# Patient Record
Sex: Male | Born: 1965 | Race: White | Hispanic: No | Smoking: Former smoker
Health system: Southern US, Community
[De-identification: ages and names within clinical notes are randomized; demographics above are authoritative.]

---

## 2014-09-18 ENCOUNTER — Other Ambulatory Visit (HOSPITAL_BASED_OUTPATIENT_CLINIC_OR_DEPARTMENT_OTHER): Payer: Self-pay | Admitting: Family Medicine

## 2014-09-18 DIAGNOSIS — E049 Nontoxic goiter, unspecified: Secondary | ICD-10-CM

## 2014-09-21 ENCOUNTER — Ambulatory Visit (HOSPITAL_BASED_OUTPATIENT_CLINIC_OR_DEPARTMENT_OTHER): Payer: BLUE CROSS/BLUE SHIELD

## 2016-12-06 ENCOUNTER — Encounter: Payer: Self-pay | Admitting: Family Medicine

## 2016-12-06 ENCOUNTER — Ambulatory Visit (INDEPENDENT_AMBULATORY_CARE_PROVIDER_SITE_OTHER): Payer: Worker's Compensation | Admitting: Family Medicine

## 2016-12-06 DIAGNOSIS — S3992XA Unspecified injury of lower back, initial encounter: Secondary | ICD-10-CM | POA: Diagnosis not present

## 2016-12-06 NOTE — Patient Instructions (Signed)
You have a lumbar strain. Ok to take tylenol for baseline pain relief (1-2 extra strength tabs 3x/day) Naproxen 500mg  twice a day with food for pain and inflammation as needed. Ok to take the percocet, robaxin you have as needed (as you know percocet has tylenol in it though so don't take these at the same time). Stay as active as possible. Start physical therapy and do home exercises, stretches on days you don't go to therapy. See work note. If not improving, will consider imaging. Follow up with me 1 month after you've started therapy for reevaluation.

## 2016-12-07 DIAGNOSIS — S3992XD Unspecified injury of lower back, subsequent encounter: Secondary | ICD-10-CM | POA: Insufficient documentation

## 2016-12-07 NOTE — Addendum Note (Signed)
Addended by: Kathi SimpersWISE, Shalise Rosado F on: 12/07/2016 04:48 PM   Modules accepted: Orders

## 2016-12-07 NOTE — Progress Notes (Addendum)
PCP: Joycelyn RuaMeyers, Stephen, MD  Subjective:   HPI: Patient is a 51 y.o. male here for low back pain.  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 On 5/8 he was gait training a patient who was starting to fall. He caught the patient but when he went to get back up Mr. Noralee StainGrover felt a sharp pain in low back more on the right side. No radiation into legs. No numbness or tingling. No bowel/bladder dysfunction. He has had two rounds of prednisone, robaxin, ibuprofen, percocet. Has improved though pain still worse with prolonged sitting. Pain level is 2/10 and a dull ache. No prior issues with his back. Hasn't started any stretches or exercises yet. Has been out of work as they don't have any light duty.  No past medical history on file.  No current outpatient prescriptions on file prior to visit.   No current facility-administered medications on file prior to visit.     No past surgical history on file.  Allergies  Allergen Reactions  . Statins     Social History   Social History  . Marital status: Divorced    Spouse name: N/A  . Number of children: N/A  . Years of education: N/A   Occupational History  . Not on file.   Social History Main Topics  . Smoking status: Never Smoker  . Smokeless tobacco: Never Used  . Alcohol use Not on file  . Drug use: Unknown  . Sexual activity: Not on file   Other Topics Concern  . Not on file   Social History Narrative  . No narrative on file    No family history on file.  BP (!) 141/95   Pulse 93   Ht 6\' 2"  (1.88 m)   Wt 250 lb (113.4 kg)   BMI 32.10 kg/m   Review of Systems: See HPI above.     Objective:  Physical Exam:  Gen: NAD, comfortable in exam room  Back: No gross deformity, scoliosis. TTP mildly right lumbar paraspinal region.  No midline or bony TTP. FROM with pain on flexion. Strength LEs 5/5 all muscle groups.   2+ MSRs in patellar and achilles tendons, equal bilaterally. Negative  SLRs. Sensation intact to light touch bilaterally. Negative logroll bilateral hips Negative fabers and piriformis stretches.   Assessment & Plan:  1. Low back injury - consistent with lumbar strain.  S/p two courses of prednisone, robaxin, ibuprofen, percocet.  Clinically improving well.  Will switch to naproxen twice a day with food.  Start physical therapy and do home exercises, stretches on days he doesn't go to therapy.  Light duty note provided - out of work if nothing available.  F/u 1 month after starting therapy.

## 2016-12-07 NOTE — Assessment & Plan Note (Signed)
consistent with lumbar strain.  S/p two courses of prednisone, robaxin, ibuprofen, percocet.  Clinically improving well.  Will switch to naproxen twice a day with food.  Start physical therapy and do home exercises, stretches on days he doesn't go to therapy.  Light duty note provided - out of work if nothing available.  F/u 1 month after starting therapy.

## 2016-12-11 ENCOUNTER — Telehealth: Payer: Self-pay | Admitting: Family Medicine

## 2016-12-11 MED ORDER — NAPROXEN 500 MG PO TABS: 500.0000 mg | ORAL_TABLET | Freq: Two times a day (BID) | ORAL | 2 refills | Status: DC | PRN

## 2016-12-11 NOTE — Telephone Encounter (Signed)
I'm sorry about that - I sent it in to [facility] [place] with a couple refills if needed.  Thanks!

## 2016-12-11 NOTE — Telephone Encounter (Signed)
Patient requesting Rx for Naproxen 500mg . States this was discussed in office visit   [facility] [place]

## 2017-01-05 ENCOUNTER — Encounter: Payer: Self-pay | Admitting: Family Medicine

## 2017-01-05 ENCOUNTER — Ambulatory Visit (INDEPENDENT_AMBULATORY_CARE_PROVIDER_SITE_OTHER): Payer: Worker's Compensation | Admitting: Family Medicine

## 2017-01-05 DIAGNOSIS — S3992XD Unspecified injury of lower back, subsequent encounter: Secondary | ICD-10-CM

## 2017-01-05 NOTE — Patient Instructions (Signed)
Continue with the physical therapy and home exercises. Continue with light duty. We will go ahead with an MRI to assess for persistent disc herniation. I will call you with results and next steps.

## 2017-01-06 NOTE — Assessment & Plan Note (Signed)
Concerning he's not improving as expected and now has some weakness of right leg with plantarflexion of ankle.  S/p 8 visits of PT.  Continue PT, home exercises.  Will go ahead with MRI to assess for disc herniation.  S/p two courses of prednisone, robaxin, ibuprofen, percocet.  Continue naproxen.  Light duty note provided - out of work if nothing available.

## 2017-01-06 NOTE — Progress Notes (Addendum)
PCP: Joycelyn RuaMeyers, Stephen, MD  Subjective:   HPI: Patient is a 51 y.o. male here for low back pain.  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 On 5/8 he was gait training a patient who was starting to fall. He caught the patient but when he went to get back up Mr. Noralee StainGrover felt a sharp pain in low back more on the right side. No radiation into legs. No numbness or tingling. No bowel/bladder dysfunction. He has had two rounds of prednisone, robaxin, ibuprofen, percocet. Has improved though pain still worse with prolonged sitting. Pain level is 2/10 and a dull ache. No prior issues with his back. Hasn't started any stretches or exercises yet. Has been out of work as they don't have any light duty.  6/29: Patient reports he's mildly improved. Doing physical therapy and home exercises. About 8 visits to date. Pain 1/10 level, up to 3/10 at times, dull. Some weakness felt in right leg - cannot walk on tiptoes on this side. Taking naproxen which helps. No bowel/bladder dysfunction. No radiation of pain into right leg.  No past medical history on file.  Current Outpatient Prescriptions on File Prior to Visit  Medication Sig Dispense Refill  . ALPRAZolam (XANAX) 0.5 MG tablet TAKE 1 TABLET BY MOUTH 3 TIMES A DAY AS NEEDED FOR ANXIETY  1  . naproxen (NAPROSYN) 500 MG tablet Take 1 tablet (500 mg total) by mouth 2 (two) times daily as needed. 60 tablet 2  . SYNTHROID 150 MCG tablet      No current facility-administered medications on file prior to visit.     No past surgical history on file.  Allergies  Allergen Reactions  . Statins     Social History   Social History  . Marital status: Divorced    Spouse name: N/A  . Number of children: N/A  . Years of education: N/A   Occupational History  . Not on file.   Social History Main Topics  . Smoking status: Never Smoker  . Smokeless tobacco: Never Used  . Alcohol use Not on file  . Drug use: Unknown   . Sexual activity: Not on file   Other Topics Concern  . Not on file   Social History Narrative  . No narrative on file    No family history on file.  BP (!) 156/92   Pulse 80   Ht 6\' 2"  (1.88 m)   Wt 250 lb (113.4 kg)   BMI 32.10 kg/m   Review of Systems: See HPI above.     Objective:  Physical Exam:  Gen: NAD, comfortable in exam room  Back: No gross deformity, scoliosis. TTP mildly right lumbar paraspinal region.  No midline or bony TTP. FROM with mild pain all motions. Strength LEs 5/5 all muscle groups though cannot toe walk right leg.   2+ MSRs in patellar and 1+ achilles tendons, equal bilaterally. Negative SLRs. Sensation intact to light touch bilaterally. Negative logroll bilateral hips Negative fabers and piriformis stretches.   Assessment & Plan:  1. Low back injury - Concerning he's not improving as expected and now has some weakness of right leg with plantarflexion of ankle.  S/p 8 visits of PT.  Continue PT, home exercises.  Will go ahead with MRI to assess for disc herniation.  S/p two courses of prednisone, robaxin, ibuprofen, percocet.  Continue naproxen.  Light duty note provided - out of work if nothing available.  Addendum:  MRI reviewed and discussed  with patient.  No evidence disc herniation though he does have degenerative changes especially lower lumbar spine.  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  This would be the nerve root irritated with weakness on plantarflexion however, difficulty toe walking.  Recommended he continue therapy for the next 2- 2 1/2 weeks and follow up with me at that time.  We discussed trial of ESI at this level, nortriptyline or gabapentin.  Advised I doubt he would need any surgical intervention.

## 2017-01-08 NOTE — Addendum Note (Signed)
Addended by: Kathi SimpersWISE, Elis Rawlinson F on: 01/08/2017 11:35 AM   Modules accepted: Orders

## 2017-02-05 ENCOUNTER — Encounter: Payer: Self-pay | Admitting: Family Medicine

## 2017-02-05 ENCOUNTER — Ambulatory Visit (INDEPENDENT_AMBULATORY_CARE_PROVIDER_SITE_OTHER): Payer: Worker's Compensation | Admitting: Family Medicine

## 2017-02-05 DIAGNOSIS — S3992XD Unspecified injury of lower back, subsequent encounter: Secondary | ICD-10-CM

## 2017-02-05 NOTE — Patient Instructions (Signed)
Continue with home exercises over next month to 6 weeks. Continue with light duty. If this stays as you are currently or worsens (I don't expect this to worsen though) I would recommend seeing a neurosurgeon for evaluation. They would likely recommend epidural steroid injection(s) but also review what surgery would entail, recovery time. Follow up with me in 5-6 weeks for reevaluation.

## 2017-02-06 NOTE — Assessment & Plan Note (Signed)
Lumbar strain with underlying degenerative changes of lumbar spine confirmed on MRI - evidence of bilateral nerve root irritation at S1 level.  He is now only slowly progressing.  Finished with physical therapy at this point - encouraged continued home exercises for next 6 weeks.  S/p two courses of prednisone, robaxin, ibuprofen, percocet.  Continue naproxen.  Light duty note provided - out of work if nothing available.  F/u in 5-6 weeks.  Discussed ESI, neurosurgery referral if not continuing to improve.

## 2017-02-06 NOTE — Progress Notes (Signed)
PCP: Joycelyn RuaMeyers, Stephen, MD  Subjective:   HPI: Patient is a 51 y.o. male here for low back pain.  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 On 5/8 he was gait training a patient who was starting to fall. He caught the patient but when he went to get back up Mr. Noralee StainGrover felt a sharp pain in low back more on the right side. No radiation into legs. No numbness or tingling. No bowel/bladder dysfunction. He has had two rounds of prednisone, robaxin, ibuprofen, percocet. Has improved though pain still worse with prolonged sitting. Pain level is 2/10 and a dull ache. No prior issues with his back. Hasn't started any stretches or exercises yet. Has been out of work as they don't have any light duty.  6/29: Patient reports he's mildly improved. Doing physical therapy and home exercises. About 8 visits to date. Pain 1/10 level, up to 3/10 at times, dull. Some weakness felt in right leg - cannot walk on tiptoes on this side. Taking naproxen which helps. No bowel/bladder dysfunction. No radiation of pain into right leg.  7/30: Patient reports he's about 90-95% better but still having some residual problems. Mild improvement in being able to walk on tiptoes on right. Has some tightness/strange feeling in posterior left leg when bending down to pick something up. Back pain is 1-2/10, soreness at rest. No skin changes. No bowel/bladder disfunction.  No past medical history on file.  Current Outpatient Prescriptions on File Prior to Visit  Medication Sig Dispense Refill  . ALPRAZolam (XANAX) 0.5 MG tablet TAKE 1 TABLET BY MOUTH 3 TIMES A DAY AS NEEDED FOR ANXIETY  1  . naproxen (NAPROSYN) 500 MG tablet Take 1 tablet (500 mg total) by mouth 2 (two) times daily as needed. 60 tablet 2  . SYNTHROID 150 MCG tablet      No current facility-administered medications on file prior to visit.     No past surgical history on file.  Allergies  Allergen Reactions  .  Statins     Social History   Social History  . Marital status: Divorced    Spouse name: N/A  . Number of children: N/A  . Years of education: N/A   Occupational History  . Not on file.   Social History Main Topics  . Smoking status: Never Smoker  . Smokeless tobacco: Never Used  . Alcohol use Not on file  . Drug use: Unknown  . Sexual activity: Not on file   Other Topics Concern  . Not on file   Social History Narrative  . No narrative on file    No family history on file.  BP (!) 145/93   Pulse (!) 106   Ht 6\' 2"  (1.88 m)   Wt 250 lb (113.4 kg)   BMI 32.10 kg/m   Review of Systems: See HPI above.     Objective:  Physical Exam:  Gen: NAD, comfortable in exam room  Back: No gross deformity, scoliosis. TTP mildly right lumbar paraspinal region.  No midline or bony TTP. FROM with mild pain on flexion, mild hamstring tightness bilaterally. Strength LEs 5/5 all muscle groups with improved right toe walk but still stronger on left.   2+ MSRs in patellar and 1+ achilles tendons, equal bilaterally. Negative SLRs. Sensation intact to light touch bilaterally. Negative logroll bilateral hips Negative fabers and piriformis stretches.   Assessment & Plan:  1. Low back injury - Lumbar strain with underlying degenerative changes of lumbar spine  confirmed on MRI - evidence of bilateral nerve root irritation at S1 level.  He is now only slowly progressing.  Finished with physical therapy at this point - encouraged continued home exercises for next 6 weeks.  S/p two courses of prednisone, robaxin, ibuprofen, percocet.  Continue naproxen.  Light duty note provided - out of work if nothing available.  F/u in 5-6 weeks.  Discussed ESI, neurosurgery referral if not continuing to improve.

## 2017-02-21 ENCOUNTER — Encounter: Payer: Self-pay | Admitting: Family Medicine

## 2017-03-08 ENCOUNTER — Other Ambulatory Visit: Payer: Self-pay | Admitting: Family Medicine

## 2017-03-19 ENCOUNTER — Ambulatory Visit (INDEPENDENT_AMBULATORY_CARE_PROVIDER_SITE_OTHER): Payer: Worker's Compensation | Admitting: Family Medicine

## 2017-03-19 ENCOUNTER — Encounter: Payer: Self-pay | Admitting: Family Medicine

## 2017-03-19 DIAGNOSIS — S3992XD Unspecified injury of lower back, subsequent encounter: Secondary | ICD-10-CM | POA: Diagnosis not present

## 2017-03-19 NOTE — Patient Instructions (Addendum)
We will refer you to a neurosurgeon at this point. Continue home exercises and stretches while we await the neurosurgery referral. Continue with light duty. Naproxen as needed. Follow up with me after you see neurosurgery (or we can touch base on the phone).

## 2017-03-20 NOTE — Addendum Note (Signed)
Addended by: Kathi SimpersWISE, Javarius Tsosie F on: 03/20/2017 10:00 AM   Modules accepted: Orders

## 2017-03-20 NOTE — Progress Notes (Signed)
PCP: [name], MD  Subjective:   HPI: Patient is a 51 y.o. male here for low back pain.  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 He caught the patient but when he went to get back up Willie Bond felt a sharp pain in low back more on the right side. No radiation into legs. No numbness or tingling. No bowel/bladder dysfunction. He has had two rounds of prednisone, robaxin, ibuprofen, percocet. Has improved though pain still worse with prolonged sitting. Pain level is 2/10 and a dull ache. No prior issues with his back. Hasn't started any stretches or exercises yet. Has been out of work as they don't have any light duty.  6/29: Patient reports he's mildly improved. Doing physical therapy and home exercises. About 8 visits to date. Pain 1/10 level, up to 3/10 at times, dull. Some weakness felt in right leg - cannot walk on tiptoes on this side. Taking naproxen which helps. No bowel/bladder dysfunction. No radiation of pain into right leg.  7/30: Patient reports he's about 90-95% better but still having some residual problems. Mild improvement in being able to walk on tiptoes on right. Has some tightness/strange feeling in posterior left leg when bending down to pick something up. Back pain is 1-2/10, soreness at rest. No skin changes. No bowel/bladder disfunction.  9/10: Patient reports pain-wise he is about the same. Pain in low back is 1-2/10 and sore. He reports right leg feels more weak than last visit despite doing home exercises and physical therapy visits. Has been taking naproxen which helps. Reporting he's getting pain into left leg now worse when reaching down, sharp. No bowel/bladder dysfunction..  No past medical history on file.  Current Outpatient Prescriptions on File Prior to Visit  Medication Sig Dispense Refill  . ALPRAZolam (XANAX) 0.5 MG tablet TAKE 1  TABLET BY MOUTH 3 TIMES A DAY AS NEEDED FOR ANXIETY  1  . lisinopril (PRINIVIL,ZESTRIL) 20 MG tablet     . naproxen (NAPROSYN) 500 MG tablet TAKE 1 TABLET (500 MG TOTAL) BY MOUTH 2 (TWO) TIMES DAILY AS NEEDED. 60 tablet 1  . SYNTHROID 150 MCG tablet      No current facility-administered medications on file prior to visit.     No past surgical history on file.  Allergies  Allergen Reactions  . Statins     Social History   Social History  . Marital status: Divorced    Spouse name: N/A  . Number of children: N/A  . Years of education: N/A   Occupational History  . Not on file.   Social History Main Topics  . Smoking status: Never Smoker  . Smokeless tobacco: Never Used  . Alcohol use Not on file  . Drug use: Unknown  . Sexual activity: Not on file   Other Topics Concern  . Not on file   Social History Narrative  . No narrative on file    No family history on file.  BP (!) 152/92   Pulse 84   Ht  (1.88 m)   Wt 250 lb (113.4 kg)   BMI 32.10 kg/m   Review of Systems: See HPI above.     Objective:  Physical Exam:  Gen: NAD, comfortable in exam room  Back: No gross deformity, scoliosis. No TTP currently including midline. FROM with mild pain on flexion, mild hamstring tightness bilaterally. Strength 4/5 with right ankle  plantarflexion - cannot do single leg calf raise on this side (unable to get heel off floor).  Otherwise strength 5/5 bilateral lower extremities.  1+ right achilles reflex, 2+ left achilles reflex and bilateral patellar reflexes. Negative SLRs. Sensation intact to light touch bilaterally. Negative logroll bilateral hips   Assessment & Plan:  1. Low back injury - Lumbar strain with underlying degenerative changes of lumbar spine confirmed on MRI - evidence of bilateral nerve root irritation at S1 level.  Unfortunately he's having worsening weakness in ankle plantarflexion on the right and pain has persisted.  Also reporting symptoms  starting into left side.  Reflex decreased right achilles tendon compared to left.  These signs/symptoms fit with bilateral S1 radiculopathy.  S/p two courses of prednisone, robaxin, ibuprofen, percocet, physical therapy, home exercises.  Will refer to neurosurgery given above weakness and lack of improvement - we discussed likelihood of needing surgical intervention.  Continue naproxen and home exercises in meantime.

## 2017-03-20 NOTE — Assessment & Plan Note (Addendum)
Lumbar strain with underlying degenerative changes of lumbar spine confirmed on MRI - evidence of bilateral nerve root irritation at S1 level.  Unfortunately he's having worsening weakness in ankle plantarflexion on the right and pain has persisted.  Also reporting symptoms starting into left side.  Reflex decreased right achilles tendon compared to left.  These signs/symptoms fit with bilateral S1 radiculopathy.  S/p two courses of prednisone, robaxin, ibuprofen, percocet, physical therapy, home exercises.  Will refer to neurosurgery given above weakness and lack of improvement - we discussed likelihood of needing surgical intervention.  Continue naproxen and home exercises in meantime.

## 2017-05-03 ENCOUNTER — Other Ambulatory Visit: Payer: Self-pay | Admitting: Family Medicine

## 2018-05-08 DIAGNOSIS — E039 Hypothyroidism, unspecified: Secondary | ICD-10-CM | POA: Diagnosis not present

## 2018-05-08 DIAGNOSIS — I10 Essential (primary) hypertension: Secondary | ICD-10-CM | POA: Diagnosis not present

## 2018-05-08 DIAGNOSIS — F419 Anxiety disorder, unspecified: Secondary | ICD-10-CM | POA: Diagnosis not present

## 2018-05-08 DIAGNOSIS — J069 Acute upper respiratory infection, unspecified: Secondary | ICD-10-CM | POA: Diagnosis not present

## 2018-05-09 DIAGNOSIS — I10 Essential (primary) hypertension: Secondary | ICD-10-CM | POA: Diagnosis not present

## 2018-05-09 DIAGNOSIS — E039 Hypothyroidism, unspecified: Secondary | ICD-10-CM | POA: Diagnosis not present

## 2018-05-09 DIAGNOSIS — F419 Anxiety disorder, unspecified: Secondary | ICD-10-CM | POA: Diagnosis not present

## 2018-05-09 DIAGNOSIS — Z125 Encounter for screening for malignant neoplasm of prostate: Secondary | ICD-10-CM | POA: Diagnosis not present

## 2018-08-06 DIAGNOSIS — I10 Essential (primary) hypertension: Secondary | ICD-10-CM | POA: Diagnosis not present

## 2019-08-18 DIAGNOSIS — I10 Essential (primary) hypertension: Secondary | ICD-10-CM | POA: Diagnosis not present

## 2019-08-18 DIAGNOSIS — F419 Anxiety disorder, unspecified: Secondary | ICD-10-CM | POA: Diagnosis not present

## 2019-08-18 DIAGNOSIS — E7800 Pure hypercholesterolemia, unspecified: Secondary | ICD-10-CM | POA: Diagnosis not present

## 2019-08-18 DIAGNOSIS — E039 Hypothyroidism, unspecified: Secondary | ICD-10-CM | POA: Diagnosis not present

## 2019-09-15 DIAGNOSIS — M25511 Pain in right shoulder: Secondary | ICD-10-CM | POA: Diagnosis not present

## 2019-09-15 DIAGNOSIS — I10 Essential (primary) hypertension: Secondary | ICD-10-CM | POA: Diagnosis not present

## 2019-09-15 DIAGNOSIS — H6123 Impacted cerumen, bilateral: Secondary | ICD-10-CM | POA: Diagnosis not present

## 2019-10-13 DIAGNOSIS — Z23 Encounter for immunization: Secondary | ICD-10-CM | POA: Diagnosis not present

## 2019-12-03 DIAGNOSIS — M25511 Pain in right shoulder: Secondary | ICD-10-CM | POA: Diagnosis not present

## 2019-12-03 DIAGNOSIS — M25512 Pain in left shoulder: Secondary | ICD-10-CM | POA: Diagnosis not present

## 2019-12-18 DIAGNOSIS — Z23 Encounter for immunization: Secondary | ICD-10-CM | POA: Diagnosis not present

## 2020-02-16 DIAGNOSIS — E7800 Pure hypercholesterolemia, unspecified: Secondary | ICD-10-CM | POA: Diagnosis not present

## 2020-02-16 DIAGNOSIS — I10 Essential (primary) hypertension: Secondary | ICD-10-CM | POA: Diagnosis not present

## 2020-02-16 DIAGNOSIS — Z0000 Encounter for general adult medical examination without abnormal findings: Secondary | ICD-10-CM | POA: Diagnosis not present

## 2020-02-16 DIAGNOSIS — E039 Hypothyroidism, unspecified: Secondary | ICD-10-CM | POA: Diagnosis not present

## 2020-02-16 DIAGNOSIS — Z125 Encounter for screening for malignant neoplasm of prostate: Secondary | ICD-10-CM | POA: Diagnosis not present

## 2020-06-17 DIAGNOSIS — R748 Abnormal levels of other serum enzymes: Secondary | ICD-10-CM | POA: Diagnosis not present

## 2020-06-17 DIAGNOSIS — E7800 Pure hypercholesterolemia, unspecified: Secondary | ICD-10-CM | POA: Diagnosis not present

## 2020-06-25 DIAGNOSIS — M542 Cervicalgia: Secondary | ICD-10-CM | POA: Diagnosis not present

## 2020-06-25 DIAGNOSIS — F419 Anxiety disorder, unspecified: Secondary | ICD-10-CM | POA: Diagnosis not present

## 2020-06-25 DIAGNOSIS — R490 Dysphonia: Secondary | ICD-10-CM | POA: Diagnosis not present

## 2021-06-29 ENCOUNTER — Other Ambulatory Visit: Payer: Self-pay | Admitting: Gastroenterology

## 2021-06-29 DIAGNOSIS — R1319 Other dysphagia: Secondary | ICD-10-CM | POA: Diagnosis not present

## 2021-06-29 DIAGNOSIS — R7989 Other specified abnormal findings of blood chemistry: Secondary | ICD-10-CM | POA: Diagnosis not present

## 2021-06-29 DIAGNOSIS — R945 Abnormal results of liver function studies: Secondary | ICD-10-CM | POA: Diagnosis not present

## 2021-06-29 DIAGNOSIS — K219 Gastro-esophageal reflux disease without esophagitis: Secondary | ICD-10-CM | POA: Diagnosis not present

## 2021-09-20 DIAGNOSIS — R748 Abnormal levels of other serum enzymes: Secondary | ICD-10-CM | POA: Diagnosis not present

## 2021-09-29 DIAGNOSIS — K219 Gastro-esophageal reflux disease without esophagitis: Secondary | ICD-10-CM | POA: Diagnosis not present

## 2021-09-29 DIAGNOSIS — K621 Rectal polyp: Secondary | ICD-10-CM | POA: Diagnosis not present

## 2021-09-29 DIAGNOSIS — K2950 Unspecified chronic gastritis without bleeding: Secondary | ICD-10-CM | POA: Diagnosis not present

## 2021-09-29 DIAGNOSIS — D123 Benign neoplasm of transverse colon: Secondary | ICD-10-CM | POA: Diagnosis not present

## 2021-09-29 DIAGNOSIS — K2289 Other specified disease of esophagus: Secondary | ICD-10-CM | POA: Diagnosis not present

## 2021-09-29 DIAGNOSIS — Z1211 Encounter for screening for malignant neoplasm of colon: Secondary | ICD-10-CM | POA: Diagnosis not present

## 2021-09-29 DIAGNOSIS — R1310 Dysphagia, unspecified: Secondary | ICD-10-CM | POA: Diagnosis not present

## 2021-09-29 DIAGNOSIS — D122 Benign neoplasm of ascending colon: Secondary | ICD-10-CM | POA: Diagnosis not present

## 2021-09-29 DIAGNOSIS — K648 Other hemorrhoids: Secondary | ICD-10-CM | POA: Diagnosis not present

## 2021-09-29 DIAGNOSIS — K2930 Chronic superficial gastritis without bleeding: Secondary | ICD-10-CM | POA: Diagnosis not present

## 2021-10-03 ENCOUNTER — Other Ambulatory Visit: Payer: Self-pay | Admitting: Gastroenterology

## 2021-10-03 DIAGNOSIS — R7989 Other specified abnormal findings of blood chemistry: Secondary | ICD-10-CM

## 2021-11-22 DIAGNOSIS — K219 Gastro-esophageal reflux disease without esophagitis: Secondary | ICD-10-CM | POA: Diagnosis not present

## 2021-11-22 DIAGNOSIS — R490 Dysphonia: Secondary | ICD-10-CM | POA: Diagnosis not present

## 2021-11-22 DIAGNOSIS — R1313 Dysphagia, pharyngeal phase: Secondary | ICD-10-CM | POA: Diagnosis not present

## 2021-12-21 NOTE — Progress Notes (Signed)
Synopsis: Referred for dyspnea by [name], MD  Subjective:   PATIENT ID: Willie Bond GENDER: male DOB: [birth year], MRN: [id number]  Chief Complaint  Patient presents with   Pulmonary Consult    Self referral.  Pt c/o cough and SOB for the past 18 months. He has hx of PNA in 2010. He has hx of asbestos exp. His cough is non prod. He has a lot of throat clearing when he wakes in the morning. He says ENT advised has to scarring and swelling around his vocal cords. Has had negative GI work up.    56yM with history of  throat clearing and hoarseness seen by ENT, HTN seen for dyspnea. Never had covid-19  At ENT visit 11/22/21 changed to protonix at night and recommended limited etoh consumption, reflux diet.   Dry cough that started 04/2020, sore throat, frequent throat clearing. Stopped lisinopril several months later, no improvement and went back on lisinopril. Dyspnea with minimal activity. Changing to protonix at night hasn't helped. Hears occasional wheeze. Cool air can make him cough. Maybe some sinus congestion/postnasal drainage but he's uncertain.   Had EGD/c-scope by [facility] in March. He first says it was normal but then says it needed to be dilated. He does have history of food impaction.   He said he was tested in past for allergy and was told he's 'allergic to everything.'  He says he desaturates at night to upper 80s with home pulse ox. He snores. He has no PND. He does feel pretty sleepy during the day.   Otherwise pertinent review of systems is negative.  No family history of lung issues  In 1998 painted car with epoxy primer and had frequent cough after that. 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 He smoked for 10 years 1.5 ppd, quit 1990.   History reviewed. No pertinent past medical history.   History reviewed. No pertinent family history.   History  reviewed. No pertinent surgical history.  Social History   Socioeconomic History   Marital status: Divorced    Spouse name: Not on file   Number of children: Not on file   Years of education: Not on file   Highest education level: Not on file  Occupational History   Not on file  Tobacco Use   Smoking status: Former    Packs/day: 2.00    Years: 10.00    Total pack years: 20.00    Types: Cigarettes    Quit date: 07/11/1987    Years since quitting: 34.4   Smokeless tobacco: Never  Vaping Use   Vaping Use: Never used  Substance and Sexual Activity   Alcohol use: Not on file   Drug use: Not on file   Sexual activity: Not on file  Other Topics Concern   Not on file  Social History Narrative   Not on file   Social Determinants of Health   Financial Resource Strain: Not on file  Food Insecurity: Not on file  Transportation Needs: Not on file  Physical Activity: Not on file  Stress: Not on file  Social Connections: Not on file  Intimate Partner Violence: Not on file     Allergies  Allergen Reactions   Statins      Outpatient Medications Prior to Visit  Medication Sig Dispense Refill   albuterol (VENTOLIN HFA) 108 (90 Base) MCG/ACT inhaler Inhale 2  puffs into the lungs every 6 (six) hours as needed for wheezing or shortness of breath.     ALPRAZolam (XANAX) 0.5 MG tablet TAKE 1 TABLET BY MOUTH 3 TIMES A DAY AS NEEDED FOR ANXIETY  1   AMLODIPINE BESYLATE PO Take by mouth. Daily- unsure of strength     aspirin EC 81 MG tablet Take 81 mg by mouth daily. Swallow whole.     DOXAZOSIN MESYLATE PO Take by mouth. Daily- unsure of strength     HYDROCHLOROTHIAZIDE PO Take by mouth. Antony Maduraaly- unsure of strength     lisinopril (PRINIVIL,ZESTRIL) 20 MG tablet      Omega-3 Fatty Acids (FISH OIL PO) Take by mouth. 1 capsule daily     SYNTHROID 150 MCG tablet Take 150 mcg by mouth daily before breakfast.     naproxen (NAPROSYN) 500 MG tablet TAKE 1 TABLET (500 MG TOTAL) BY MOUTH 2 (TWO)  TIMES DAILY AS NEEDED. 60 tablet 1   No facility-administered medications prior to visit.       Objective:   Physical Exam:  General appearance: 56 y.o., male, NAD, conversant  Eyes: anicteric sclerae; PERRL, tracking appropriately HENT: NCAT; MMM Neck: Trachea midline; no lymphadenopathy, no JVD Lungs: CTAB, no crackles, no wheeze, with normal respiratory effort CV: RRR, no murmur  Abdomen: Soft, non-tender; non-distended, BS present  Extremities: varicose veins BLE, LLE swelling some dark discoloration, warm Skin: Normal turgor and texture; no rash Psych: Appropriate affect Neuro: Alert and oriented to person and place, no focal deficit     Vitals:   12/22/21 1530  BP: 126/74  Pulse: 86  Temp: 98 F (36.7 C)  TempSrc: Oral  SpO2: 96%  Weight: 271 lb (122.9 kg)  Height: 6\' 2"  (1.88 m)   96% on RA BMI Readings from Last 3 Encounters:  12/22/21 34.79 kg/m  03/19/17 32.10 kg/m  02/05/17 32.10 kg/m   Wt Readings from Last 3 Encounters:  12/22/21 271 lb (122.9 kg)  03/19/17 250 lb (113.4 kg)  02/05/17 250 lb (113.4 kg)     CBC No results found for: "WBC", "RBC", "HGB", "HCT", "PLT", "MCV", "MCH", "MCHC", "RDW", "LYMPHSABS", "MONOABS", "EOSABS", "BASOSABS"    Chest Imaging: CXR 12/22/21 reviewed by me with small focus of scar in lingula awaiting final read  Pulmonary Functions Testing Results:     No data to display              Assessment & Plan:   # DOE Deconditioning, untreated OSA, asthma/COPD, CHF, angina are possibilities. Asbestosis or other ILD seems less likely based on CXR today   # Chronic cough Suspect primarily GERD/LPR related given history of stricture/reflux, ACEi remains possible - he was only off of lisinopril for a month in the past.   # Nocturnal hypoxia   # LLE swelling  Plan: - cbc/diff, BMP, BNP, TSH next visit (no phlebotomist in clinic this afternoon) - protonix 30 minutes before dinner - [facility]'ll [facility] to get [facility] records - albuterol as needed  - let's get you off of lisinopril and switch you to losartan - talk to Dr. Lenise ArenaMeyers about that, Can have lisinopril associated cough for up to 6 months-1 year after stopping it.  - PFTs  on same day as next clinic visit - ultrasound of your legs you'll be called to schedule  - declines home sleep study now, may need to revisit in future - see you in 6 weeks with labwork on same day as clinic appointment as  well!     [name], MD [facility] 12/22/2021 5:53 PM

## 2021-12-22 ENCOUNTER — Ambulatory Visit (INDEPENDENT_AMBULATORY_CARE_PROVIDER_SITE_OTHER): Payer: BC Managed Care – PPO

## 2021-12-22 ENCOUNTER — Encounter: Payer: Self-pay | Admitting: Student

## 2021-12-22 ENCOUNTER — Ambulatory Visit (INDEPENDENT_AMBULATORY_CARE_PROVIDER_SITE_OTHER): Payer: BC Managed Care – PPO | Admitting: Student

## 2021-12-22 VITALS — BP 126/74 | HR 86 | Temp 98.0°F | Ht 74.0 in | Wt 271.0 lb

## 2021-12-22 DIAGNOSIS — M7989 Other specified soft tissue disorders: Secondary | ICD-10-CM | POA: Diagnosis not present

## 2021-12-22 DIAGNOSIS — R0683 Snoring: Secondary | ICD-10-CM | POA: Diagnosis not present

## 2021-12-22 DIAGNOSIS — R053 Chronic cough: Secondary | ICD-10-CM | POA: Diagnosis not present

## 2021-12-22 DIAGNOSIS — R0609 Other forms of dyspnea: Secondary | ICD-10-CM | POA: Diagnosis not present

## 2021-12-22 NOTE — Patient Instructions (Addendum)
-   x ray today - protonix 30 minutes before dinner - we'll try to get [facility] records - albuterol as needed  - let's get you off of lisinopril and switch you to losartan - talk to Dr. Lenise Arena about that, Can have lisinopril associated cough for up to 6 months-1 year after stopping it.  - PFTs  on same day as next clinic visit - ultrasound of your legs you'll be called to schedule  - see you in 6 weeks with labwork on same day as clinic appointment as well!

## 2021-12-23 ENCOUNTER — Telehealth: Payer: Self-pay | Admitting: Student

## 2021-12-23 ENCOUNTER — Other Ambulatory Visit: Payer: Self-pay | Admitting: Student

## 2021-12-23 ENCOUNTER — Encounter (HOSPITAL_COMMUNITY): Payer: BC Managed Care – PPO

## 2021-12-23 DIAGNOSIS — M7989 Other specified soft tissue disorders: Secondary | ICD-10-CM

## 2021-12-23 NOTE — Telephone Encounter (Signed)
Order was placed for an asap vascular ultrasound.  I had pt scheduled for today at 4:00.  When I spoke to pt he states he does not want to go for study.  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  He did some sort of test and it came back negative.  He states he hurt his back and one leg is smaller than the other due to his muscles have atrophied due to the injury.  I told him I would cancel the appt and make Dr Thora Lance aware.

## 2022-01-13 DIAGNOSIS — F419 Anxiety disorder, unspecified: Secondary | ICD-10-CM | POA: Diagnosis not present

## 2022-01-13 DIAGNOSIS — K219 Gastro-esophageal reflux disease without esophagitis: Secondary | ICD-10-CM | POA: Diagnosis not present

## 2022-01-13 DIAGNOSIS — R059 Cough, unspecified: Secondary | ICD-10-CM | POA: Diagnosis not present

## 2022-01-13 DIAGNOSIS — I10 Essential (primary) hypertension: Secondary | ICD-10-CM | POA: Diagnosis not present

## 2022-02-14 NOTE — Progress Notes (Unsigned)
Synopsis: Referred for dyspnea by [name], MD  Subjective:   PATIENT ID: Willie Bond GENDER: male DOB: 07/24/1965, MRN: [id number]  No chief complaint on file.  56yM with history of  throat clearing and hoarseness seen by ENT, HTN seen for dyspnea. Never had covid-19  At ENT visit 11/22/21 changed to protonix at night and recommended limited etoh consumption, reflux diet.   Dry cough that started 04/2020, sore throat, frequent throat clearing. Stopped lisinopril several months later, no improvement and went back on lisinopril. Dyspnea with minimal activity. Changing to protonix at night hasn't helped. Hears occasional wheeze. Cool air can make him cough. Maybe some sinus congestion/postnasal drainage but he's uncertain.   Had EGD/c-scope by [facility] in March. He first says it was normal but then says it needed to be dilated. He does have history of food impaction.   He said he was tested in past for allergy and was told he's 'allergic to everything.'  He says he desaturates at night to upper 80s with home pulse ox. He snores. He has no PND. He does feel pretty sleepy during the day.   Otherwise pertinent review of systems is negative.  No family history of lung issues  In 1998 painted car with epoxy primer and had frequent cough after that. 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 He smoked for 10 years 1.5 ppd, quit 1990.   Interval HPI: Started on protonix last visit and PFT today  Lab workup of DOE ordered.   No past medical history on file.   No family history on file.   No past surgical history on file.  Social History   Socioeconomic History   Marital status: Divorced    Spouse name: Not on file   Number of children: Not on file   Years of education: Not on file   Highest education level: Not on file  Occupational History   Not on file  Tobacco Use    Smoking status: Former    Packs/day: 2.00    Years: 10.00    Total pack years: 20.00    Types: Cigarettes    Quit date: 07/11/1987    Years since quitting: 34.6   Smokeless tobacco: Never  Vaping Use   Vaping Use: Never used  Substance and Sexual Activity   Alcohol use: Not on file   Drug use: Not on file   Sexual activity: Not on file  Other Topics Concern   Not on file  Social History Narrative   Not on file   Social Determinants of Health   Financial Resource Strain: Not on file  Food Insecurity: Not on file  Transportation Needs: Not on file  Physical Activity: Not on file  Stress: Not on file  Social Connections: Not on file  Intimate Partner Violence: Not on file     Allergies  Allergen Reactions   Statins      Outpatient Medications Prior to Visit  Medication Sig Dispense Refill   albuterol (VENTOLIN HFA) 108 (90 Base) MCG/ACT inhaler Inhale 2 puffs into the lungs every 6 (six) hours as needed for wheezing or shortness of breath.     ALPRAZolam (XANAX) 0.5 MG tablet TAKE 1 TABLET BY MOUTH 3 TIMES A DAY AS NEEDED FOR ANXIETY  1   AMLODIPINE BESYLATE PO Take by mouth. Daily- unsure of strength     aspirin  EC 81 MG tablet Take 81 mg by mouth daily. Swallow whole.     DOXAZOSIN MESYLATE PO Take by mouth. Daily- unsure of strength     HYDROCHLOROTHIAZIDE PO Take by mouth. Antony Madura- unsure of strength     lisinopril (PRINIVIL,ZESTRIL) 20 MG tablet      Omega-3 Fatty Acids (FISH OIL PO) Take by mouth. 1 capsule daily     SYNTHROID 150 MCG tablet Take 150 mcg by mouth daily before breakfast.     No facility-administered medications prior to visit.       Objective:   Physical Exam:  General appearance: 56 y.o., male, NAD, conversant  Eyes: anicteric sclerae; PERRL, tracking appropriately HENT: NCAT; MMM Neck: Trachea midline; no lymphadenopathy, no JVD Lungs: CTAB, no crackles, no wheeze, with normal respiratory effort CV: RRR, no murmur  Abdomen: Soft,  non-tender; non-distended, BS present  Extremities: varicose veins BLE, LLE swelling some dark discoloration, warm Skin: Normal turgor and texture; no rash Psych: Appropriate affect Neuro: Alert and oriented to person and place, no focal deficit     There were no vitals filed for this visit.    on RA BMI Readings from Last 3 Encounters:  12/22/21 34.79 kg/m  03/19/17 32.10 kg/m  02/05/17 32.10 kg/m   Wt Readings from Last 3 Encounters:  12/22/21 271 lb (122.9 kg)  03/19/17 250 lb (113.4 kg)  02/05/17 250 lb (113.4 kg)     CBC No results found for: "WBC", "RBC", "HGB", "HCT", "PLT", "MCV", "MCH", "MCHC", "RDW", "LYMPHSABS", "MONOABS", "EOSABS", "BASOSABS"    Chest Imaging: CXR 12/22/21 reviewed by me with small focus of scar in lingula  Pulmonary Functions Testing Results:     No data to display              Assessment & Plan:   # DOE Deconditioning, untreated OSA, asthma/COPD, CHF, angina are possibilities. Asbestosis or other ILD seems less likely based on CXR today   # Chronic cough Suspect primarily GERD/LPR related given history of stricture/reflux, ACEi remains possible - he was only off of lisinopril for a month in the past.   # Nocturnal hypoxia   # LLE swelling  Plan: - cbc/diff, BMP, BNP, TSH next visit (no phlebotomist in clinic this afternoon) - protonix 30 minutes before dinner - we'll try to get [facility] records - albuterol as needed  - let's get you off of lisinopril and switch you to losartan - talk to Dr. Lenise Arena about that, Can have lisinopril associated cough for up to 6 months-1 year after stopping it.  - PFTs  on same day as next clinic visit - ultrasound of your legs you'll be called to schedule  - declines home sleep study now, may need to revisit in future - see you in 6 weeks with labwork on same day as clinic appointment as well!     [name], MD [facility] 02/14/2022 6:23 PM

## 2022-02-15 ENCOUNTER — Encounter: Payer: Self-pay | Admitting: Student

## 2022-02-15 ENCOUNTER — Ambulatory Visit (INDEPENDENT_AMBULATORY_CARE_PROVIDER_SITE_OTHER): Payer: BC Managed Care – PPO | Admitting: Student

## 2022-02-15 ENCOUNTER — Telehealth: Payer: Self-pay | Admitting: Student

## 2022-02-15 VITALS — BP 132/74 | HR 86 | Temp 97.8°F | Ht 74.0 in | Wt 262.0 lb

## 2022-02-15 DIAGNOSIS — R053 Chronic cough: Secondary | ICD-10-CM

## 2022-02-15 DIAGNOSIS — J45998 Other asthma: Secondary | ICD-10-CM | POA: Diagnosis not present

## 2022-02-15 LAB — PULMONARY FUNCTION TEST
DL/VA % pred: 144 %
DL/VA: 6.08 ml/min/mmHg/L
DLCO cor % pred: 115 %
DLCO cor: 36.8 ml/min/mmHg
DLCO unc % pred: 115 %
DLCO unc: 36.8 ml/min/mmHg
FEF 25-75 Post: 4.44 L/sec
FEF 25-75 Pre: 2.66 L/sec
FEF2575-%Change-Post: 66 %
FEF2575-%Pred-Post: 126 %
FEF2575-%Pred-Pre: 75 %
FEV1-%Change-Post: 18 %
FEV1-%Pred-Post: 66 %
FEV1-%Pred-Pre: 56 %
FEV1-Post: 2.82 L
FEV1-Pre: 2.38 L
FEV1FVC-%Change-Post: 0 %
FEV1FVC-%Pred-Pre: 109 %
FEV6-%Change-Post: 19 %
FEV6-%Pred-Post: 63 %
FEV6-%Pred-Pre: 53 %
FEV6-Post: 3.41 L
FEV6-Pre: 2.86 L
FEV6FVC-%Pred-Post: 104 %
FEV6FVC-%Pred-Pre: 104 %
FVC-%Change-Post: 19 %
FVC-%Pred-Post: 61 %
FVC-%Pred-Pre: 51 %
FVC-Post: 3.41 L
FVC-Pre: 2.86 L
Post FEV1/FVC ratio: 83 %
Post FEV6/FVC ratio: 100 %
Pre FEV1/FVC ratio: 83 %
Pre FEV6/FVC Ratio: 100 %
RV % pred: 131 %
RV: 3.15 L
TLC % pred: 86 %
TLC: 6.7 L

## 2022-02-15 NOTE — Patient Instructions (Signed)
Full PFT performed today. °

## 2022-02-15 NOTE — Progress Notes (Signed)
Full PFT performed today. °

## 2022-02-15 NOTE — Telephone Encounter (Signed)
What is least expensive laba/ics for him?  Thanks! 

## 2022-02-15 NOTE — Patient Instructions (Addendum)
- I'll send my chart message once I hear back from pharmacy about which LABA/ICS inhaler is most affordable to try for asthma - would stay on omeprazole 40 mg 30 minutes before dinner - see instructions below for throat clearing/LPR - see you in 3 months or sooner if need be   You are a chronic throat clearer! You are not alone! The causes of chronic throat clearing include acid reflux (laryngopharyngeal reflux), allergies, environmental irritants such as tobacco smoke and air pollution, and asthma. If present for a long time throat clearing can become habit forming. When you clear your throat, you are transferring mucus from your throat up into your mouth and nose. We all secrete up to 2 liters (imagine a big Coke bottle) of mucus a day. This saliva is usually swallowed and ends up in the toilet eventually. By clearing the mucus back into your mouth and nose you are sending the saliva in the wrong direction. This is counterproductive. Unless you are walking around spitting all day (which most throat clearers do not do), the mucus will work its way back down to the throat and eventually be swallowed. Get the mucus going in the right direction. Swallow! Swallow! Swallow! No throat clearing.  Chronic throat clearing is damaging. The trauma from the throat clearing can cause redness and swelling of your vocal cords. If the clearing is very excessive small growths (granulomas) can form. These granulomas can get so large that they can eventually affect your breathing. Surgical removal may be necessary. The irritation and swelling produced by the clearing can cause saliva to sit in your throat. This causes more throat clearing. More throat clearing causes more stagnant mucus which causes more throat clearing, which causes more mucus, etc. A vicious cycle will ensue and the habit can be very difficult to break. Without your help and a conscious effort on your part to break the cycle, the throat clearing will never  stop.  Your doctor may prescribe medication and behavioral modifications to treat acid reflux disease. Nose and throat sprays may be prescribed to treat underlying allergies or asthma. Avoiding possible irritants will be recommended. Without changes to your behavior these treatments will not be successful. The following alterations are recommended:  Do not clear your throat. Swallow instead. This gets the mucus going in the right direction towards the toilet.  Carry around some water to assist with swallowing and mucus clearance. When you feel the urge to clear your throat take a sip of the water. If you absolutely need to clear your throat perform a non-traumatic throat clear. To do this pant with your mouth open and say "[place], [place], [place]" with a powerful but very breathy voice. This will clear the secretions without causing damage. Increase your water intake. This will thin secretions and make it easier to swallow. Comply with the behavior recommendations for reflux disease. Chew baking soda ([employer]) gum. This can be found on the internet or in the tooth paste isle of your pharmacy. Gum chewing can help with swallowing, reflux, and throat clearing. Chew three pieces a day. If you develop jaw discomfort or headaches decrease the amount of gum chewing. Tell your friends and family to tell you to swallow when you clear your throat. Some people have been clearing so long that they don't even know when they are doing it. Be patient. The urge to clear your throat will not go away overnight. It may take 8 or 12 weeks for the medication and  behavior modifications to work.

## 2022-02-16 ENCOUNTER — Other Ambulatory Visit (HOSPITAL_COMMUNITY): Payer: Self-pay

## 2022-02-16 MED ORDER — FLUTICASONE-SALMETEROL 250-50 MCG/ACT IN AEPB
1.0000 | INHALATION_SPRAY | Freq: Two times a day (BID) | RESPIRATORY_TRACT | 11 refills | Status: DC
Start: 2022-02-16 — End: 2022-03-03

## 2022-03-03 MED ORDER — FLUTICASONE-SALMETEROL 250-50 MCG/ACT IN AEPB: 1.0000 | INHALATION_SPRAY | Freq: Two times a day (BID) | RESPIRATORY_TRACT | 11 refills | Status: AC

## 2022-03-03 NOTE — Addendum Note (Signed)
Addended by: Wyvonne Lenz on: 03/03/2022 09:46 AM   Modules accepted: Orders

## 2022-07-12 DIAGNOSIS — Z7251 High risk heterosexual behavior: Secondary | ICD-10-CM | POA: Diagnosis not present

## 2022-07-12 DIAGNOSIS — E039 Hypothyroidism, unspecified: Secondary | ICD-10-CM | POA: Diagnosis not present

## 2022-07-12 DIAGNOSIS — R21 Rash and other nonspecific skin eruption: Secondary | ICD-10-CM | POA: Diagnosis not present

## 2022-07-12 DIAGNOSIS — I10 Essential (primary) hypertension: Secondary | ICD-10-CM | POA: Diagnosis not present

## 2022-07-12 DIAGNOSIS — A6000 Herpesviral infection of urogenital system, unspecified: Secondary | ICD-10-CM | POA: Diagnosis not present

## 2022-11-29 DIAGNOSIS — N451 Epididymitis: Secondary | ICD-10-CM | POA: Diagnosis not present

## 2023-02-20 DIAGNOSIS — N50819 Testicular pain, unspecified: Secondary | ICD-10-CM | POA: Diagnosis not present

## 2023-02-20 DIAGNOSIS — N433 Hydrocele, unspecified: Secondary | ICD-10-CM | POA: Diagnosis not present

## 2023-02-20 DIAGNOSIS — N5089 Other specified disorders of the male genital organs: Secondary | ICD-10-CM | POA: Diagnosis not present

## 2023-03-30 DIAGNOSIS — Z23 Encounter for immunization: Secondary | ICD-10-CM | POA: Diagnosis not present

## 2023-08-04 IMAGING — DX DG CHEST 2V
2 series · 2 of 2 positions shown · non-contrast
Comparison: None Available.

CLINICAL DATA: Chronic cough.

EXAM:
CHEST - 2 VIEW

[chest pa]
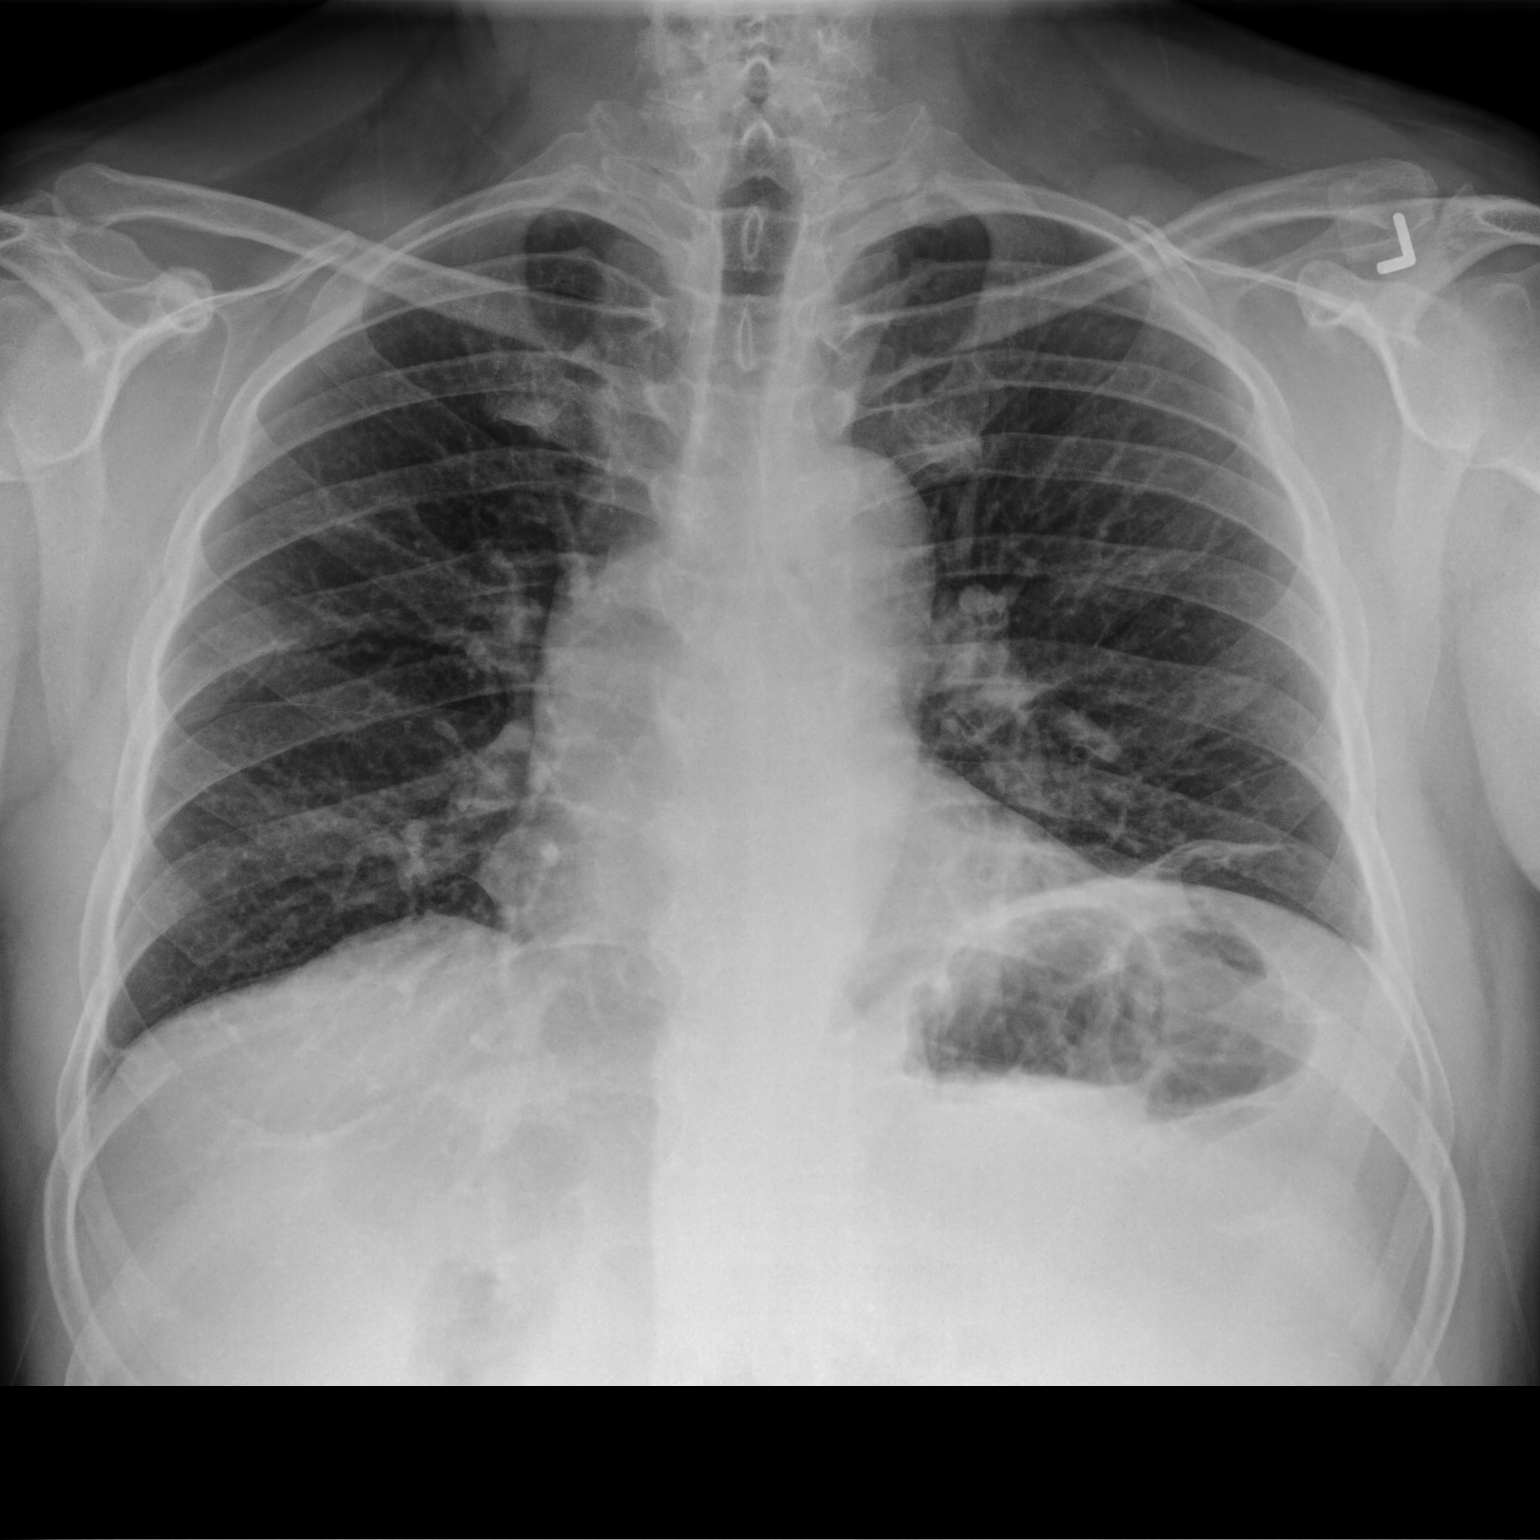

[chest lat]
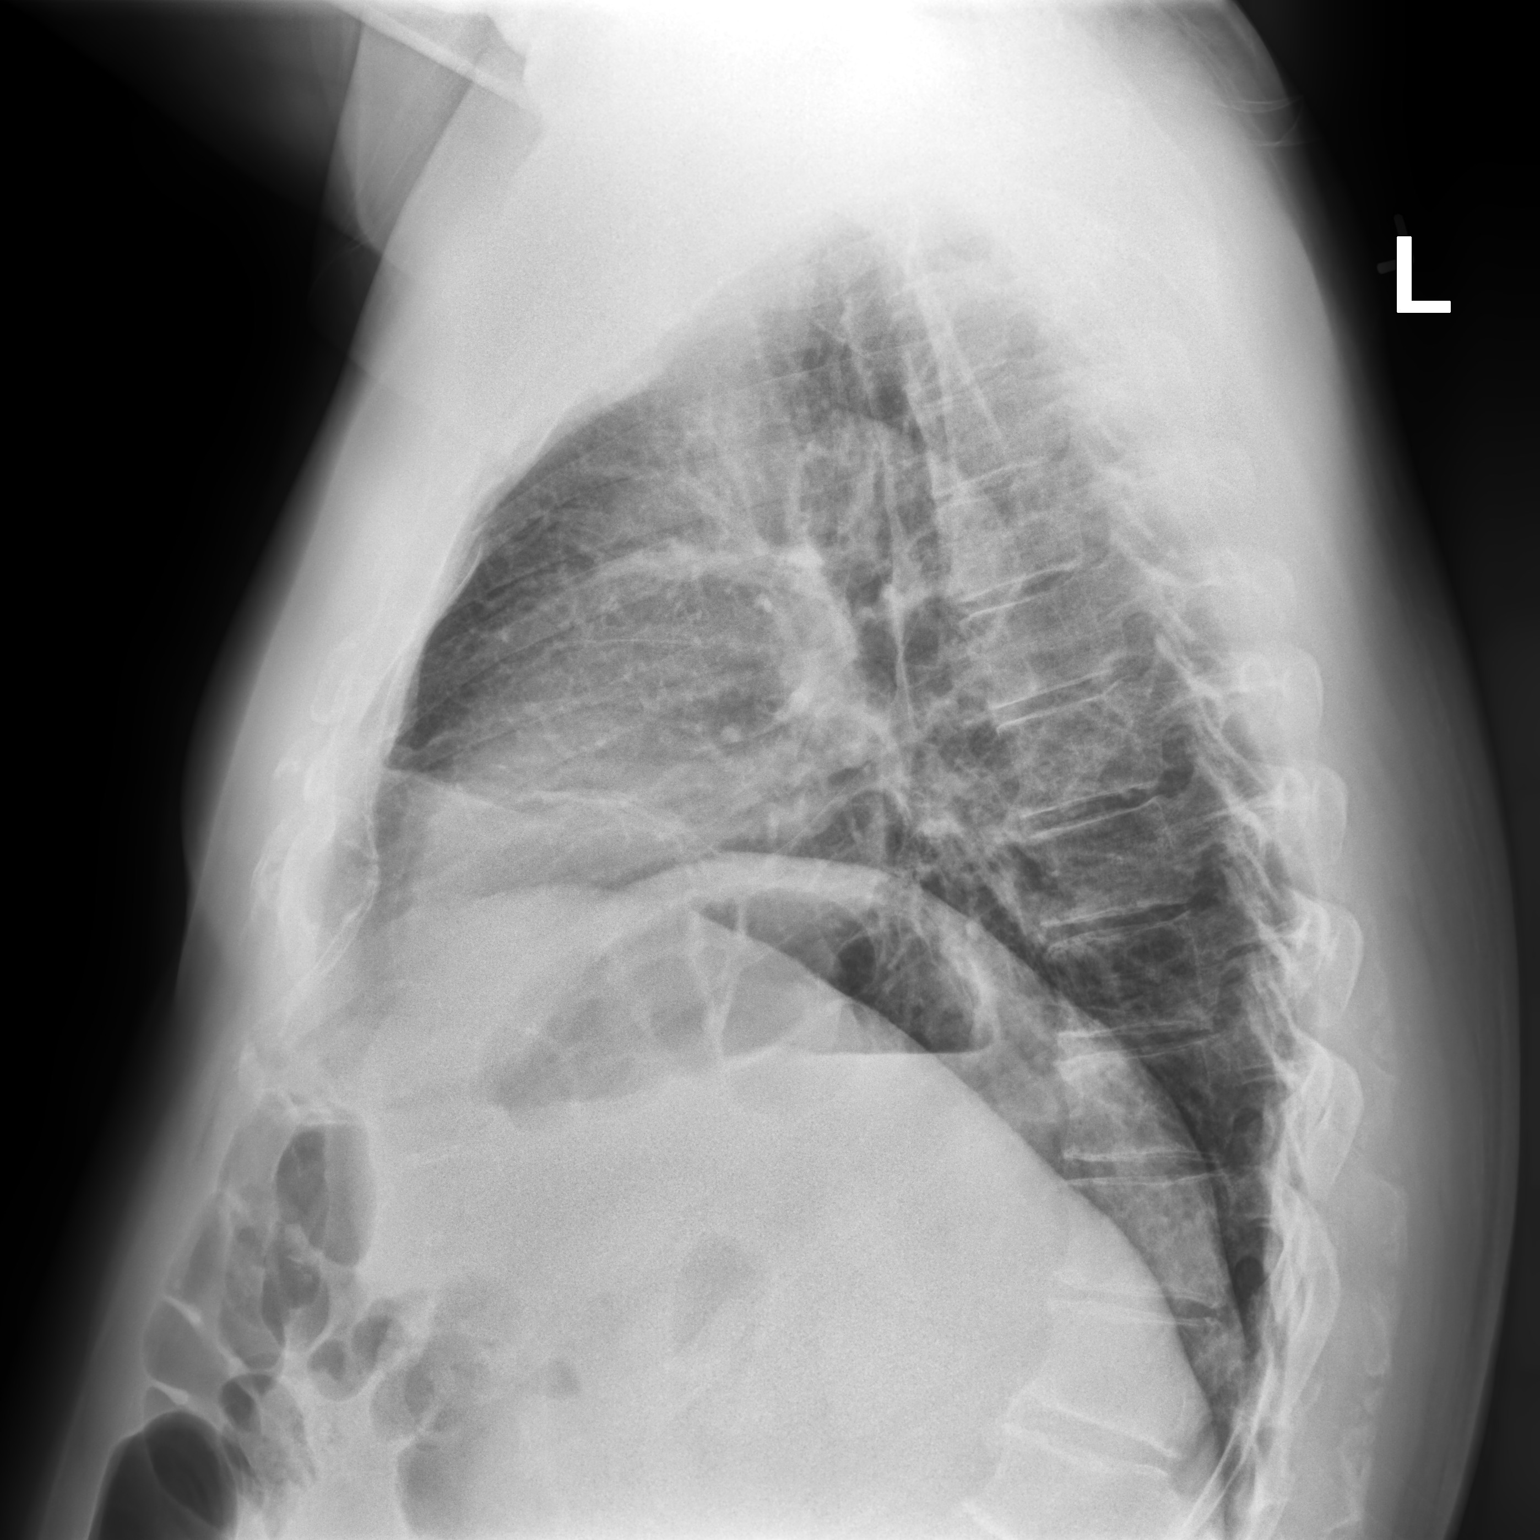

[2 of 2 positions shown; findings below may reference images not displayed]

FINDINGS: The heart size and mediastinal contours are within normal limits.
Linear opacity seen in the inferior lingula, which may be due to
scarring or subsegmental atelectasis. No evidence of pulmonary
airspace disease or edema. No evidence of pleural effusion.
IMPRESSION: Mild lingular scarring versus subsegmental atelectasis.

## 2023-10-03 DIAGNOSIS — N529 Male erectile dysfunction, unspecified: Secondary | ICD-10-CM | POA: Diagnosis not present

## 2023-10-03 DIAGNOSIS — I10 Essential (primary) hypertension: Secondary | ICD-10-CM | POA: Diagnosis not present

## 2023-10-03 DIAGNOSIS — E039 Hypothyroidism, unspecified: Secondary | ICD-10-CM | POA: Diagnosis not present

## 2023-10-03 DIAGNOSIS — F419 Anxiety disorder, unspecified: Secondary | ICD-10-CM | POA: Diagnosis not present

## 2023-10-04 DIAGNOSIS — I10 Essential (primary) hypertension: Secondary | ICD-10-CM | POA: Diagnosis not present

## 2024-04-28 DIAGNOSIS — M25511 Pain in right shoulder: Secondary | ICD-10-CM | POA: Diagnosis not present

## 2024-04-28 DIAGNOSIS — M25512 Pain in left shoulder: Secondary | ICD-10-CM | POA: Diagnosis not present

## 2024-05-12 DIAGNOSIS — Z23 Encounter for immunization: Secondary | ICD-10-CM | POA: Diagnosis not present
